# Patient Record
Sex: Female | Born: 1995 | Race: Black or African American | Hispanic: No | Marital: Single | Smoking: Never smoker
Health system: Southern US, Community
[De-identification: ages and names within clinical notes are randomized; demographics above are authoritative.]

## PROBLEM LIST (undated history)

## (undated) DIAGNOSIS — E611 Iron deficiency: Secondary | ICD-10-CM

## (undated) DIAGNOSIS — J45909 Unspecified asthma, uncomplicated: Secondary | ICD-10-CM

---
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
## 2015-09-12 ENCOUNTER — Encounter (HOSPITAL_BASED_OUTPATIENT_CLINIC_OR_DEPARTMENT_OTHER): Payer: Self-pay | Admitting: Emergency Medicine

## 2015-09-12 ENCOUNTER — Emergency Department (HOSPITAL_BASED_OUTPATIENT_CLINIC_OR_DEPARTMENT_OTHER)
Admission: EM | Admit: 2015-09-12 | Discharge: 2015-09-12 | Disposition: A | Discharge status: Home / Self Care | Payer: Medicaid Other | Attending: Emergency Medicine | Admitting: Emergency Medicine

## 2015-09-12 DIAGNOSIS — R112 Nausea with vomiting, unspecified: Secondary | ICD-10-CM | POA: Insufficient documentation

## 2015-09-12 DIAGNOSIS — J45909 Unspecified asthma, uncomplicated: Secondary | ICD-10-CM | POA: Insufficient documentation

## 2015-09-12 DIAGNOSIS — B349 Viral infection, unspecified: Secondary | ICD-10-CM

## 2015-09-12 DIAGNOSIS — Z3202 Encounter for pregnancy test, result negative: Secondary | ICD-10-CM | POA: Insufficient documentation

## 2015-09-12 DIAGNOSIS — Z862 Personal history of diseases of the blood and blood-forming organs and certain disorders involving the immune mechanism: Secondary | ICD-10-CM | POA: Insufficient documentation

## 2015-09-12 DIAGNOSIS — R05 Cough: Secondary | ICD-10-CM | POA: Diagnosis present

## 2015-09-12 HISTORY — DX: Unspecified asthma, uncomplicated: J45.909

## 2015-09-12 HISTORY — DX: Iron deficiency: E61.1

## 2015-09-12 LAB — URINALYSIS, ROUTINE W REFLEX MICROSCOPIC
Bilirubin Urine: NEGATIVE
GLUCOSE, UA: NEGATIVE mg/dL
HGB URINE DIPSTICK: NEGATIVE
Ketones, ur: NEGATIVE mg/dL
Leukocytes, UA: NEGATIVE
Nitrite: NEGATIVE
Protein, ur: NEGATIVE mg/dL
SPECIFIC GRAVITY, URINE: 1.027 (ref 1.005–1.030)
pH: 6.5 (ref 5.0–8.0)

## 2015-09-12 LAB — PREGNANCY, URINE: PREG TEST UR: NEGATIVE

## 2015-09-12 MED ORDER — ONDANSETRON 4 MG PO TBDP
4.0000 mg | ORAL_TABLET | Freq: Once | ORAL | Status: AC
  Administered 2015-09-12: 4 mg via ORAL
  Filled 2015-09-12: qty 1

## 2015-09-12 MED ORDER — ONDANSETRON 4 MG PO TBDP: 4.0000 mg | ORAL_TABLET | Freq: Three times a day (TID) | ORAL | Status: AC | PRN

## 2015-09-12 NOTE — ED Provider Notes (Signed)
CSN: [id number]     Arrival date & time 09/12/15  2029 History  By signing my name below, I, Terrance Branch, attest that this documentation has been prepared under the direction and in the presence of Paula LibraJohn Taura Lamarre, MD. Electronically Signed: Evon Slackerrance Branch, ED Scribe. 09/12/2015. 11:22 PM.     Chief Complaint  Patient presents with  . Vomiting     The history is provided by the patient. No language interpreter was used.   HPI Comments: Lisa Pacheco is a 19 y.o. female who presents to the Emergency Department complaining of nausea and vomiting. Pt states that for the past 2 days she has not been feeling well with general malaise, cough, nasal congestion, chills and subjective fever. She vomited twice today but is no longer nauseated. Pt doesn't report any medications PTA. Pt doesn't report any worsening or alleviating factors. Denies diarrhea or abdominal pain.    Past Medical History  Diagnosis Date  . Low iron   . Asthma    History reviewed. No pertinent past surgical history. History reviewed. No pertinent family history. Social History  Substance Use Topics  . Smoking status: Never Smoker   . Smokeless tobacco: None  . Alcohol Use: No   OB History    No data available     Review of Systems   10 Systems reviewed and all are negative for acute change except as noted in the HPI.    Allergies  Review of patient's allergies indicates no known allergies.  Home Medications   Prior to Admission medications   Medication Sig Start Date End Date Taking? Authorizing Provider  ondansetron (ZOFRAN ODT) 4 MG disintegrating tablet Take 1-2 tablets (4-8 mg total) by mouth every 8 (eight) hours as needed for nausea or vomiting. 09/12/15   Destiney Sanabia, MD   BP 97/45 mmHg  Pulse 68  Temp(Src) 99.4 F (37.4 C) (Oral)  Resp 17  Ht 5\' 1"  (1.549 m)  Wt 126 lb (57.153 kg)  BMI 23.82 kg/m2  SpO2 100%  LMP 08/18/2015 (Approximate)   Physical Exam  General: Well-developed,  well-nourished female in no acute distress; appearance consistent with age of record HENT: normocephalic; atraumatic Eyes: pupils equal, round and reactive to light; extraocular muscles intact Neck: supple Heart: regular rate and rhythm Lungs: clear to auscultation bilaterally Abdomen: soft; nondistended; nontender; no masses or hepatosplenomegaly; hyperactive bowel sounds present Extremities: No deformity; full range of motion; pulses normal Neurologic: Awake, alert and oriented; motor function intact in all extremities and symmetric; no facial droop Skin: Warm and dry Psychiatric: Normal mood and affect   ED Course  Procedures (including critical care time) DIAGNOSTIC STUDIES: Oxygen Saturation is 100% on RA, normal by my interpretation.    COORDINATION OF CARE: 11:19 PM-Discussed treatment plan with pt at bedside and pt agreed to plan.     MDM   Nursing notes and vitals signs, including pulse oximetry, reviewed.  Summary of this visit's results, reviewed by myself:  Labs:  Results for orders placed or performed during the hospital encounter of 09/12/15 (from the past 24 hour(s))  Urinalysis, Routine w reflex microscopic ([facility])     Status: None   Collection Time: 09/12/15 10:00 PM  Result Value Ref Range   Color, Urine YELLOW YELLOW   APPearance CLEAR CLEAR   Specific Gravity, Urine 1.027 1.005 - 1.030   pH 6.5 5.0 - 8.0   Glucose, UA NEGATIVE NEGATIVE mg/dL   Hgb urine dipstick NEGATIVE NEGATIVE   Bilirubin Urine  NEGATIVE NEGATIVE   Ketones, ur NEGATIVE NEGATIVE mg/dL   Protein, ur NEGATIVE NEGATIVE mg/dL   Nitrite NEGATIVE NEGATIVE   Leukocytes, UA NEGATIVE NEGATIVE  Pregnancy, urine     Status: None   Collection Time: 09/12/15 10:00 PM  Result Value Ref Range   Preg Test, Ur NEGATIVE NEGATIVE    Imaging Studies: No results found.   Final diagnoses:  Viral illness   I personally performed the services described in this documentation, which was  scribed in my presence. The recorded information has been reviewed and is accurate.      Paula Libra, MD 09/12/15 [phone]

## 2015-09-12 NOTE — ED Notes (Signed)
Patient reports that she has not been felling well and sleeping a lot lately. Reports that she threw up a little bit this am. The patient states that she has low iron
